# Patient Record
Sex: Male | Born: 1970 | Race: White | Hispanic: No | Marital: Single | Smoking: Light tobacco smoker
Health system: Southern US, Community
[De-identification: ages and names within clinical notes are randomized; demographics above are authoritative.]

## PROBLEM LIST (undated history)

## (undated) DIAGNOSIS — I10 Essential (primary) hypertension: Secondary | ICD-10-CM

## (undated) HISTORY — PX: APPENDECTOMY: SHX54

---

## 2014-02-20 ENCOUNTER — Emergency Department (HOSPITAL_COMMUNITY): Payer: PRIVATE HEALTH INSURANCE

## 2014-02-20 ENCOUNTER — Emergency Department (HOSPITAL_COMMUNITY)
Admission: EM | Admit: 2014-02-20 | Discharge: 2014-02-20 | Disposition: A | Discharge status: Home / Self Care | Payer: Self-pay | Attending: Emergency Medicine | Admitting: Emergency Medicine

## 2014-02-20 ENCOUNTER — Encounter (HOSPITAL_COMMUNITY): Payer: Self-pay | Admitting: Emergency Medicine

## 2014-02-20 DIAGNOSIS — I10 Essential (primary) hypertension: Secondary | ICD-10-CM | POA: Insufficient documentation

## 2014-02-20 DIAGNOSIS — Z720 Tobacco use: Secondary | ICD-10-CM | POA: Insufficient documentation

## 2014-02-20 DIAGNOSIS — Z9089 Acquired absence of other organs: Secondary | ICD-10-CM | POA: Insufficient documentation

## 2014-02-20 DIAGNOSIS — R1013 Epigastric pain: Secondary | ICD-10-CM | POA: Insufficient documentation

## 2014-02-20 HISTORY — DX: Essential (primary) hypertension: I10

## 2014-02-20 LAB — CBC WITH DIFFERENTIAL/PLATELET
BASOS ABS: 0 10*3/uL (ref 0.0–0.1)
BASOS PCT: 0 % (ref 0–1)
EOS ABS: 0.1 10*3/uL (ref 0.0–0.7)
EOS PCT: 1 % (ref 0–5)
HCT: 40.7 % (ref 39.0–52.0)
Hemoglobin: 14.8 g/dL (ref 13.0–17.0)
Lymphocytes Relative: 24 % (ref 12–46)
Lymphs Abs: 1.8 10*3/uL (ref 0.7–4.0)
MCH: 33.5 pg (ref 26.0–34.0)
MCHC: 36.4 g/dL — AB (ref 30.0–36.0)
MCV: 92.1 fL (ref 78.0–100.0)
Monocytes Absolute: 0.7 10*3/uL (ref 0.1–1.0)
Monocytes Relative: 9 % (ref 3–12)
NEUTROS PCT: 66 % (ref 43–77)
Neutro Abs: 5.1 10*3/uL (ref 1.7–7.7)
PLATELETS: 203 10*3/uL (ref 150–400)
RBC: 4.42 MIL/uL (ref 4.22–5.81)
RDW: 12.4 % (ref 11.5–15.5)
WBC: 7.7 10*3/uL (ref 4.0–10.5)

## 2014-02-20 LAB — COMPREHENSIVE METABOLIC PANEL
ALBUMIN: 4.2 g/dL (ref 3.5–5.2)
ALK PHOS: 63 U/L (ref 39–117)
ALT: 21 U/L (ref 0–53)
AST: 23 U/L (ref 0–37)
Anion gap: 13 (ref 5–15)
BUN: 14 mg/dL (ref 6–23)
CO2: 25 mEq/L (ref 19–32)
Calcium: 9.3 mg/dL (ref 8.4–10.5)
Chloride: 103 mEq/L (ref 96–112)
Creatinine, Ser: 1.07 mg/dL (ref 0.50–1.35)
GFR calc non Af Amer: 85 mL/min — ABNORMAL LOW (ref >90)
Glucose, Bld: 104 mg/dL — ABNORMAL HIGH (ref 70–99)
POTASSIUM: 4.2 meq/L (ref 3.7–5.3)
SODIUM: 141 meq/L (ref 137–147)
TOTAL PROTEIN: 7.1 g/dL (ref 6.0–8.3)
Total Bilirubin: 0.3 mg/dL (ref 0.3–1.2)

## 2014-02-20 LAB — LIPASE, BLOOD: Lipase: 70 U/L — ABNORMAL HIGH (ref 11–59)

## 2014-02-20 LAB — I-STAT TROPONIN, ED: Troponin i, poc: 0 ng/mL (ref 0.00–0.08)

## 2014-02-20 MED ORDER — PANTOPRAZOLE SODIUM 40 MG PO TBEC
40.0000 mg | DELAYED_RELEASE_TABLET | Freq: Once | ORAL | Status: AC
  Administered 2014-02-20: 40 mg via ORAL
  Filled 2014-02-20: qty 1

## 2014-02-20 MED ORDER — RANITIDINE HCL 150 MG PO CAPS: 150.0000 mg | ORAL_CAPSULE | Freq: Every day | ORAL | Status: AC

## 2014-02-20 MED ORDER — GI COCKTAIL ~~LOC~~
30.0000 mL | Freq: Once | ORAL | Status: AC
  Administered 2014-02-20: 30 mL via ORAL
  Filled 2014-02-20: qty 30

## 2014-02-20 MED ORDER — ESOMEPRAZOLE MAGNESIUM 40 MG PO CPDR: 40.0000 mg | DELAYED_RELEASE_CAPSULE | Freq: Every day | ORAL | Status: AC

## 2014-02-20 NOTE — ED Notes (Signed)
Pt having epigastric pain sts he feels like burning in chest. sts 2 weeks.
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
## 2014-02-20 NOTE — Discharge Instructions (Signed)
Please take nexium and zantac as prescribed.  Follow up with your doctor for further care.  Return if you develop worsening pain, fever, vomit blood or having dark tarry stool.    Peptic Ulcer A peptic ulcer is a sore in the lining of your esophagus (esophageal ulcer), stomach (gastric ulcer), or in the first part of your small intestine (duodenal ulcer). The ulcer causes erosion into the deeper tissue. CAUSES  Normally, the lining of the stomach and the small intestine protects itself from the acid that digests food. The protective lining can be damaged by:  An infection caused by a bacterium called Helicobacter pylori (H. pylori).  Regular use of nonsteroidal anti-inflammatory drugs (NSAIDs), such as ibuprofen or aspirin.  Smoking tobacco. Other risk factors include being older than 50, drinking alcohol excessively, and having a family history of ulcer disease.  SYMPTOMS   Burning pain or gnawing in the area between the chest and the belly button.  Heartburn.  Nausea and vomiting.  Bloating. The pain can be worse on an empty stomach and at night. If the ulcer results in bleeding, it can cause:  Black, tarry stools.  Vomiting of bright red blood.  Vomiting of coffee-ground-looking materials. DIAGNOSIS  A diagnosis is usually made based upon your history and an exam. Other tests and procedures may be performed to find the cause of the ulcer. Finding a cause will help determine the best treatment. Tests and procedures may include:  Blood tests, stool tests, or breath tests to check for the bacterium H. pylori.  An upper gastrointestinal (GI) series of the esophagus, stomach, and small intestine.  An endoscopy to examine the esophagus, stomach, and small intestine.  A biopsy. TREATMENT  Treatment may include:  Eliminating the cause of the ulcer, such as smoking, NSAIDs, or alcohol.  Medicines to reduce the amount of acid in your digestive tract.  Antibiotic medicines if  the ulcer is caused by the H. pylori bacterium.  An upper endoscopy to treat a bleeding ulcer.  Surgery if the bleeding is severe or if the ulcer created a hole somewhere in the digestive system. HOME CARE INSTRUCTIONS   Avoid tobacco, alcohol, and caffeine. Smoking can increase the acid in the stomach, and continued smoking will impair the healing of ulcers.  Avoid foods and drinks that seem to cause discomfort or aggravate your ulcer.  Only take medicines as directed by your caregiver. Do not substitute over-the-counter medicines for prescription medicines without talking to your caregiver.  Keep any follow-up appointments and tests as directed. SEEK MEDICAL CARE IF:   Your do not improve within 7 days of starting treatment.  You have ongoing indigestion or heartburn. SEEK IMMEDIATE MEDICAL CARE IF:   You have sudden, sharp, or persistent abdominal pain.  You have bloody or dark black, tarry stools.  You vomit blood or vomit that looks like coffee grounds.  You become light-headed, weak, or feel faint.  You become sweaty or clammy. MAKE SURE YOU:   Understand these instructions.  Will watch your condition.  Will get help right away if you are not doing well or get worse. Document Released: 04/14/2000 Document Revised: 09/01/2013 Document Reviewed: 11/15/2011 [facility] Patient Information 2015 [place] [employer], [place]. This information is not intended to replace advice given to you by your health care provider. Make sure you discuss any questions you have with your health care provider.

## 2014-02-27 NOTE — ED Provider Notes (Signed)
Medical screening examination/treatment/procedure(s) were performed by non-physician practitioner and as supervising physician I was immediately available for consultation/collaboration.   EKG Interpretation   Date/Time:  Friday February 20 2014 11:15:36 EDT Ventricular Rate:  76 PR Interval:  164 QRS Duration: 125 QT Interval:  372 QTC Calculation: 418 R Axis:   89 Text Interpretation:  Sinus rhythm Right bundle branch block ST elev,  probable normal early repol pattern No previous ECGs available Confirmed  by [facility]  [facility], Jonny RuizJOHN ([id number]) on 02/20/2014 1:01:09 PM       Hurman HornJohn M Sorrel Cassetta, MD 02/27/14 [phone]
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
## 2015-09-17 IMAGING — US US ABDOMEN COMPLETE
1 series · 14 of 25 positions shown · non-contrast
Comparison: None.

CLINICAL DATA: 42-year-old with epigastric pain. History of
hypertension.

EXAM:
ULTRASOUND ABDOMEN COMPLETE

[Series 1: us abdomen complete · 0.24mm/px · 14 of 61 slices shown]
[im 1/61]
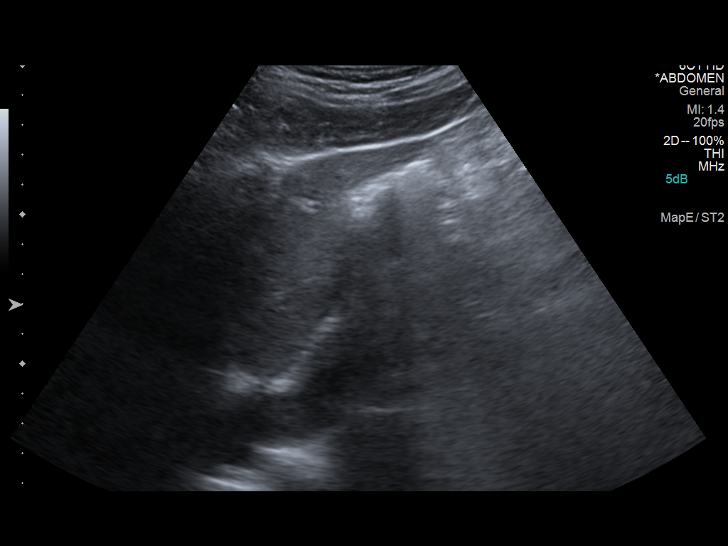
[im 6/61]
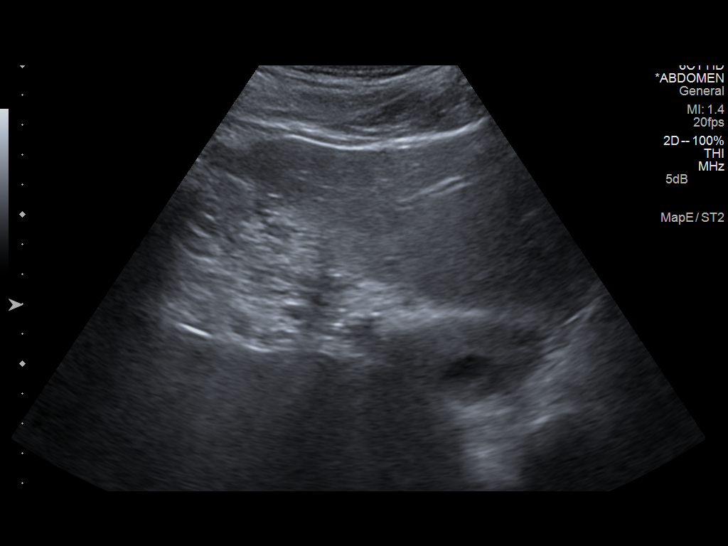
[im 11/61]
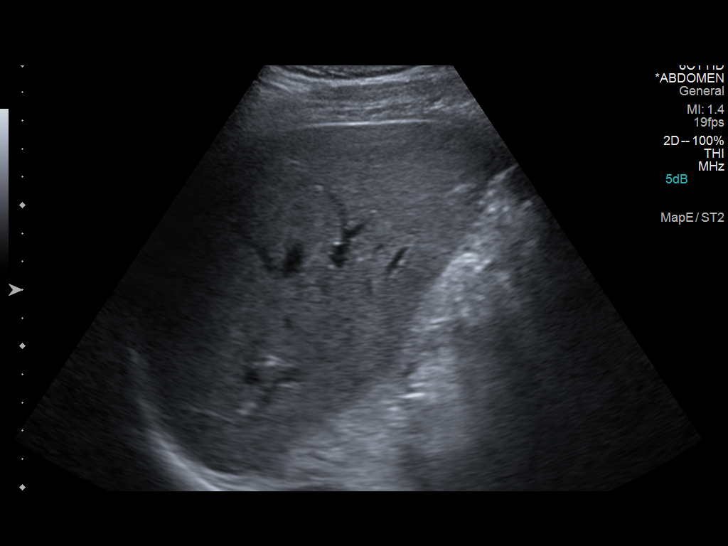
[im 16/61]
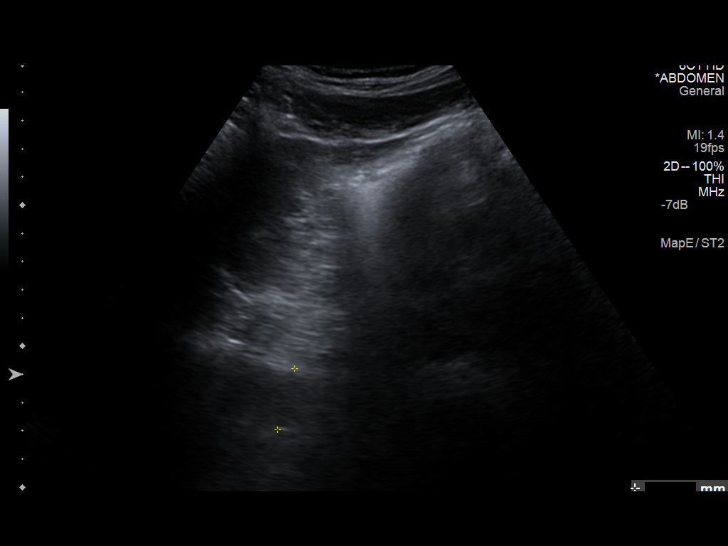
[im 21/61]
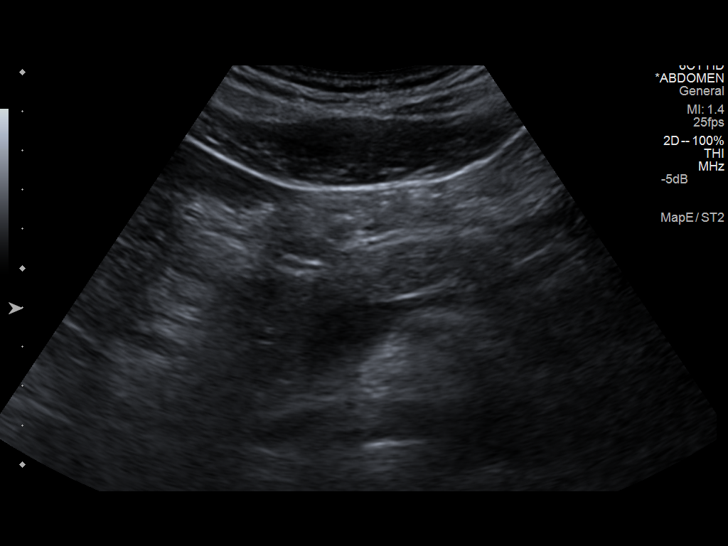
[im 23/61]
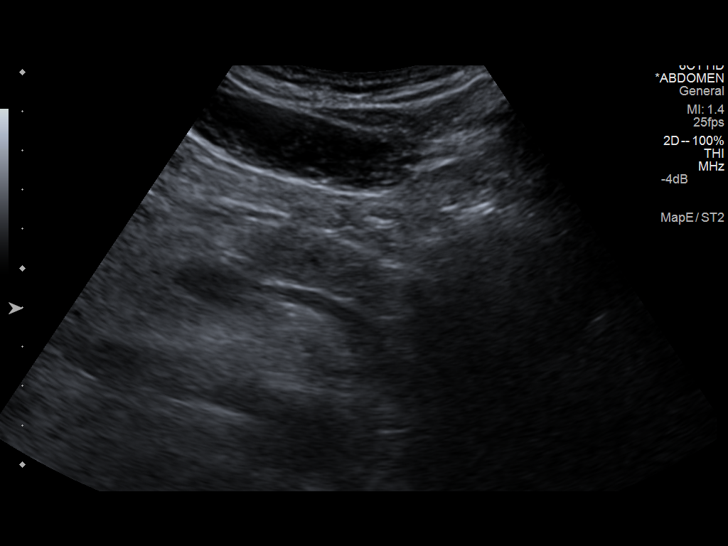
[im 28/61]
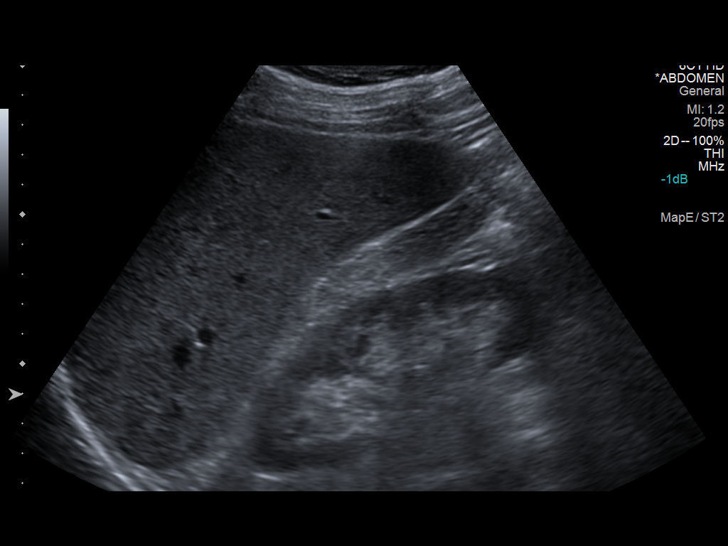
[im 33/61]
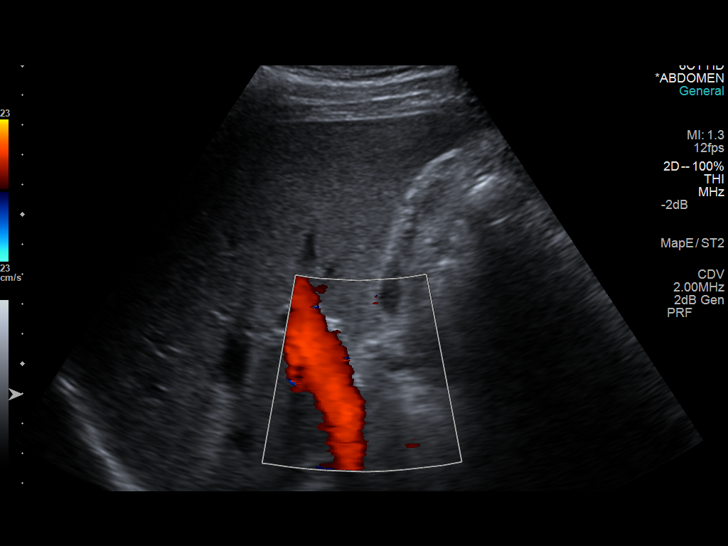
[im 38/61]
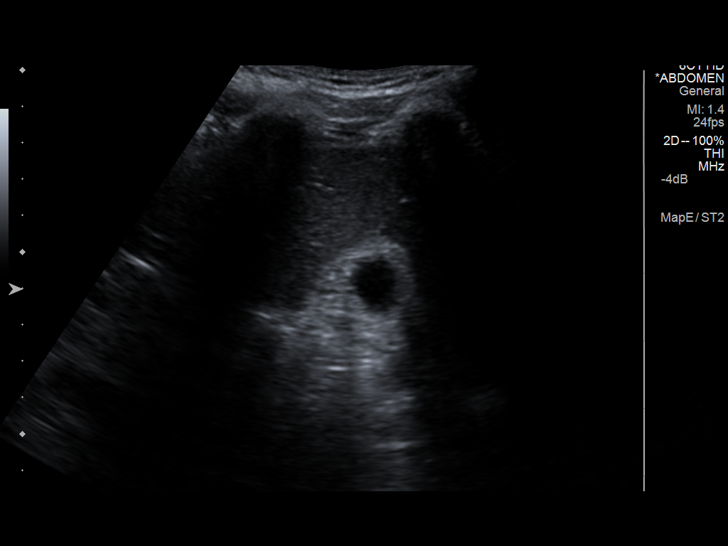
[im 41/61]
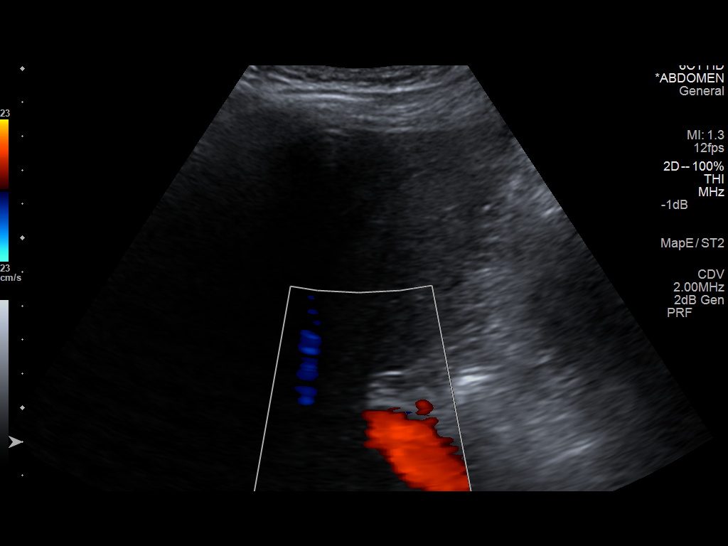
[im 46/61]
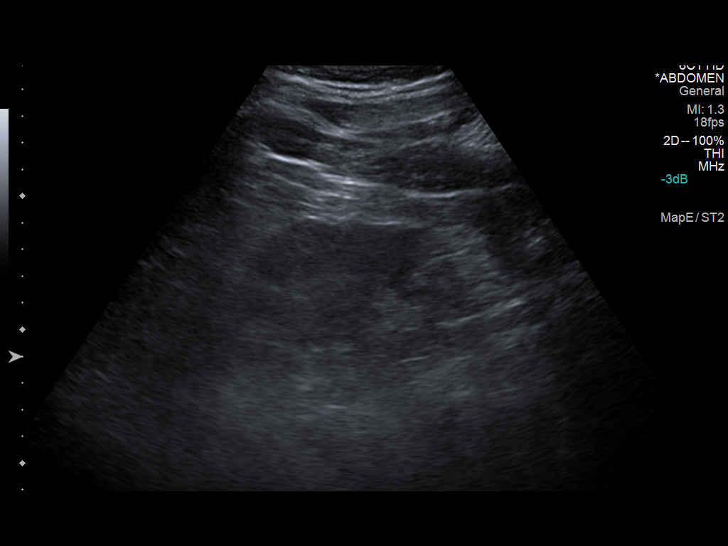
[im 51/61]
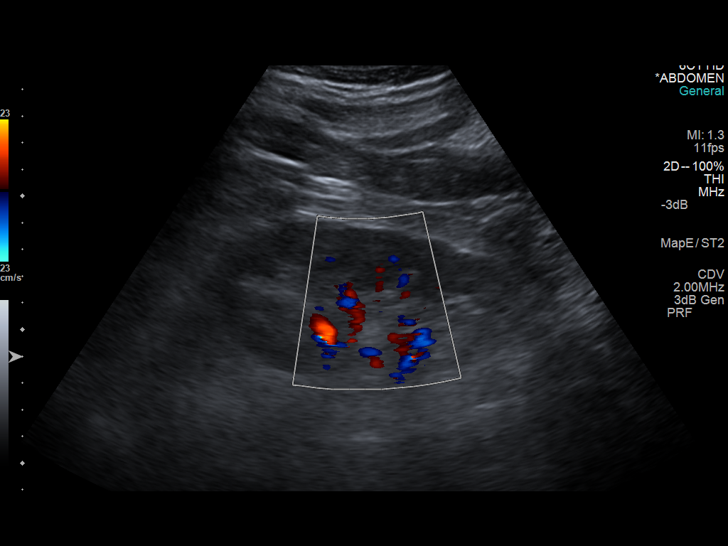
[im 56/61]
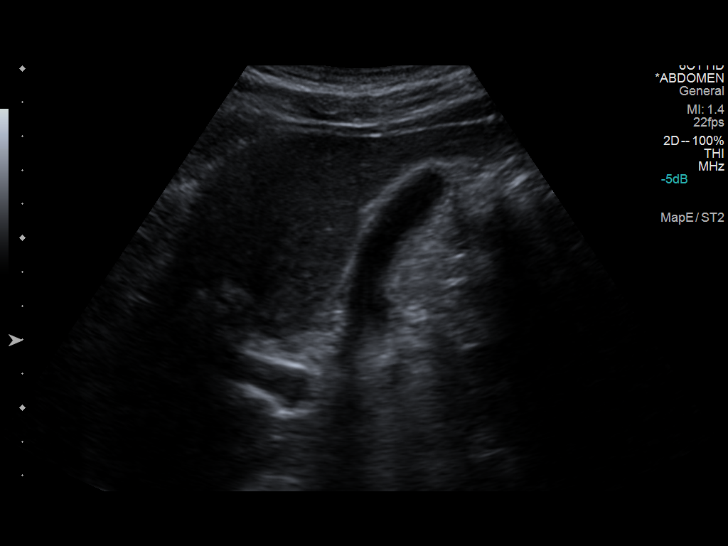
[im 61/61]
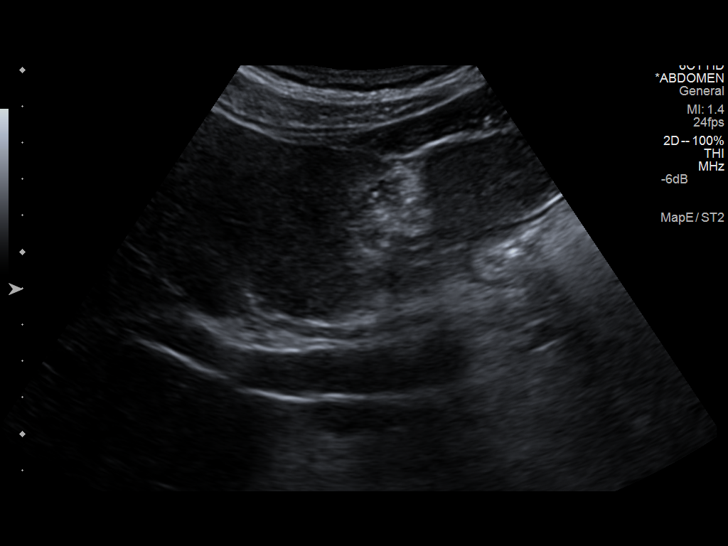

[14 of 25 positions shown; findings below may reference images not displayed]

FINDINGS: Gallbladder: No gallstones or wall thickening visualized. No
sonographic Murphy sign noted.

Common bile duct: Diameter: 0.4 cm

Liver: No focal lesion identified. Within normal limits in
parenchymal echogenicity.

IVC: No abnormality visualized.

Pancreas: Visualized portion unremarkable.

Spleen: Size and appearance within normal limits.

Right Kidney: Length: 11.1 cm. Echogenicity within normal limits. No
mass or hydronephrosis visualized.

Left Kidney: Length: 12.4 cm. Echogenicity within normal limits. No
mass or hydronephrosis visualized.

Abdominal aorta: No aneurysm visualized.

Other findings: None.
IMPRESSION: Normal abdominal ultrasound.
